# Patient Record
Sex: Male | Born: 1970 | Race: Black or African American | Hispanic: No | Marital: Single | Smoking: Never smoker
Health system: Southern US, Community
[De-identification: ages and names within clinical notes are randomized; demographics above are authoritative.]

---

## 1999-04-30 ENCOUNTER — Emergency Department (HOSPITAL_COMMUNITY): Admission: EM | Admit: 1999-04-30 | Discharge: 1999-04-30 | Payer: Self-pay | Admitting: Emergency Medicine

## 1999-06-19 ENCOUNTER — Ambulatory Visit (HOSPITAL_COMMUNITY): Admission: RE | Admit: 1999-06-19 | Discharge: 1999-06-19 | Payer: Self-pay | Admitting: Internal Medicine

## 1999-06-19 ENCOUNTER — Encounter: Payer: Self-pay | Admitting: Internal Medicine

## 2007-05-22 ENCOUNTER — Emergency Department (HOSPITAL_COMMUNITY): Admission: EM | Admit: 2007-05-22 | Discharge: 2007-05-22 | Payer: Self-pay | Admitting: Family Medicine

## 2009-08-18 ENCOUNTER — Emergency Department (HOSPITAL_COMMUNITY): Admission: EM | Admit: 2009-08-18 | Discharge: 2009-08-18 | Payer: Self-pay | Admitting: Family Medicine

## 2009-08-28 ENCOUNTER — Emergency Department (HOSPITAL_COMMUNITY): Admission: EM | Admit: 2009-08-28 | Discharge: 2009-08-28 | Payer: Self-pay | Admitting: Emergency Medicine

## 2009-10-11 ENCOUNTER — Ambulatory Visit (HOSPITAL_COMMUNITY): Admission: RE | Admit: 2009-10-11 | Discharge: 2009-10-11 | Payer: Self-pay | Admitting: Internal Medicine

## 2010-02-13 ENCOUNTER — Ambulatory Visit (HOSPITAL_COMMUNITY): Admission: RE | Admit: 2010-02-13 | Discharge: 2010-02-13 | Payer: Self-pay | Admitting: Internal Medicine

## 2010-03-22 ENCOUNTER — Ambulatory Visit (HOSPITAL_COMMUNITY): Admission: RE | Admit: 2010-03-22 | Discharge: 2010-03-22 | Payer: Self-pay | Admitting: Internal Medicine

## 2010-08-27 LAB — POCT RAPID STREP A (OFFICE): Streptococcus, Group A Screen (Direct): NEGATIVE

## 2010-08-27 LAB — STREP A DNA PROBE: Group A Strep Probe: NEGATIVE

## 2011-01-10 ENCOUNTER — Ambulatory Visit (HOSPITAL_BASED_OUTPATIENT_CLINIC_OR_DEPARTMENT_OTHER): Payer: BC Managed Care – PPO | Attending: Internal Medicine

## 2011-01-10 DIAGNOSIS — G4710 Hypersomnia, unspecified: Secondary | ICD-10-CM | POA: Insufficient documentation

## 2011-01-12 DIAGNOSIS — G4710 Hypersomnia, unspecified: Secondary | ICD-10-CM

## 2011-01-12 DIAGNOSIS — G4730 Sleep apnea, unspecified: Secondary | ICD-10-CM

## 2011-01-12 NOTE — Procedures (Signed)
NAME:  [place], ANTOLIN NO.:  [id number]  MEDICAL RECORD NO.:  [id number]          PATIENT TYPE:  OUT  LOCATION:  SLEEP CENTER                 FACILITY:  [facility]  PHYSICIAN:  Albertus Chiarelli D. Maple Hudson, MD, FCCP, FACPDATE OF BIRTH:  Nov 07[birth year] 1970  DATE OF STUDY:  01/10/2011                           NOCTURNAL POLYSOMNOGRAM  REFERRING PHYSICIAN:  Margaretmary Bayley, M.D.  INDICATION FOR STUDY:  Hypersomnia with sleep apnea.  EPWORTH SLEEPINESS SCORE:  14/24, BMI 30.1.  Weight 210 pounds, height 70 inches.  Neck 16 inches.  MEDICATIONS:  Home medications are charted as "none".  SLEEP ARCHITECTURE:  Total sleep time 363.5 minutes with sleep efficiency 94%.  Stage I 2.2%, stage II 84.2%, stage III 1%, REM 12.7% of total sleep time.  Sleep latency 14 minutes, REM latency 9 minutes, awake after sleep onset 8.5 minutes, arousal index 23.1.  BEDTIME MEDICATION:  None.  RESPIRATORY DATA:  Apnea-hypopnea index (AHI) 2.8 per hour.  A total of 17 events was scored including 1 obstructive apnea, 1 central apnea and 15 hypopneas.  Events were not positional.  REM/AHI 5.2 per hour.  There were insufficient numbers of events to qualify for CPAP titration on this study night.  OXYGEN DATA:  Moderate snoring with oxygen desaturation to a nadir of 91% and a mean oxygen saturation through the study of 94.9% on room air.  CARDIAC DATA:  Normal sinus rhythm.  MOVEMENT-PARASOMNIA:  No significant movement disturbance.  No bathroom trips.  IMPRESSIONS-RECOMMENDATIONS: 1. Unremarkable sleep architecture for sleep center environment with     no medication taken. 2. Occasional respiratory event with sleep disturbance, within normal     limits, AHI 2.8 per hour (normal adult     range 0-5 per hour).  Moderate snoring with oxygen desaturation to     a nadir of 91% and a mean oxygen saturation through the study of     94.9% on room air.     Deaveon Schoen D. Maple Hudson, MD, [facility], FACP Diplomate, Research scientist (medical) of Sleep Medicine Electronically Signed    CDY/MEDQ  D:  01/12/2011 10:14:37  T:  01/12/2011 10:39:00  Job:  [id number]

## 2011-01-16 ENCOUNTER — Encounter (HOSPITAL_BASED_OUTPATIENT_CLINIC_OR_DEPARTMENT_OTHER): Payer: Self-pay

## 2017-05-23 ENCOUNTER — Encounter (INDEPENDENT_AMBULATORY_CARE_PROVIDER_SITE_OTHER): Payer: Self-pay | Admitting: Orthopaedic Surgery

## 2017-05-23 ENCOUNTER — Ambulatory Visit (INDEPENDENT_AMBULATORY_CARE_PROVIDER_SITE_OTHER): Payer: Self-pay

## 2017-05-23 ENCOUNTER — Ambulatory Visit (INDEPENDENT_AMBULATORY_CARE_PROVIDER_SITE_OTHER): Payer: BLUE CROSS/BLUE SHIELD | Admitting: Orthopaedic Surgery

## 2017-05-23 VITALS — BP 139/85 | HR 68 | Resp 14 | Ht 71.0 in | Wt 217.0 lb

## 2017-05-23 DIAGNOSIS — M25562 Pain in left knee: Secondary | ICD-10-CM | POA: Diagnosis not present

## 2017-05-23 MED ORDER — METHYLPREDNISOLONE ACETATE 40 MG/ML IJ SUSP
80.0000 mg | INTRAMUSCULAR | Status: AC | PRN
  Administered 2017-05-23: 80 mg

## 2017-05-23 MED ORDER — BUPIVACAINE HCL 0.5 % IJ SOLN
2.0000 mL | INTRAMUSCULAR | Status: AC | PRN
  Administered 2017-05-23: 2 mL via INTRA_ARTICULAR

## 2017-05-23 MED ORDER — LIDOCAINE HCL 1 % IJ SOLN
2.0000 mL | INTRAMUSCULAR | Status: AC | PRN
  Administered 2017-05-23: 2 mL

## 2017-05-23 NOTE — Progress Notes (Signed)
Office Visit Note   Patient: Edward Kennedy           Date of Birth: 01/30/1971           MRN: [id number] Visit Date: 05/23/2017              Requested by: No referring provider defined for this encounter. PCP: Laurena Slimmerlark, Preston S, MD   Assessment & Plan: Visit Diagnoses:  1. Acute pain of left knee     Plan: Recent onset of pain and effusion left knee without injury or trauma. Works out at [employer] 3-4 times a week. Pain is predominantly along the medial compartment. He externally could have a tear of the medial meniscus. I'm going to aspirate his knee and injected cortisone and monitor his response  Follow-Up Instructions: Return if symptoms worsen or fail to improve.   Orders:  Orders Placed This Encounter  Procedures  . Large Joint Inj: L knee  . XR KNEE 3 VIEW LEFT   No orders of the defined types were placed in this encounter.     Procedures: Large Joint Inj: L knee on 05/23/2017 1:23 PM Indications: pain and diagnostic evaluation Details: 25 G 1.5 in needle, anteromedial approach  Arthrogram: No  Medications: 2 mL lidocaine 1 %; 2 mL bupivacaine 0.5 %; 80 mg methylPREDNISolone acetate 40 MG/ML Aspirate: 30 mL blood-tinged and clear Outcome: tolerated well, no immediate complications Procedure, treatment alternatives, risks and benefits explained, specific risks discussed. Consent was given by the patient. Patient was prepped and draped in the usual sterile fashion.       Clinical Data: No additional findings.   Subjective: Chief Complaint  Patient presents with  . Left Knee - Joint Swelling  . Joint Swelling    Pain with walking x 3 weeks after working out, grinding, stiffness, swelling, no injury, no surgery, not diabetic  No recent injury or trauma. Has had some effusion in his left knee. No feeling of instability. Predominant pain is along the medial compartment. Occasionally feels some fullness in the popliteal space. No calf pain no numbness or  tingling  HPI  Review of Systems  Constitutional: Negative for activity change.  HENT: Negative for trouble swallowing.   Eyes: Negative for pain.  Respiratory: Negative for shortness of breath.   Cardiovascular: Positive for leg swelling.  Gastrointestinal: Negative for constipation.  Endocrine: Negative for cold intolerance.  Genitourinary: Negative for difficulty urinating.  Musculoskeletal: Positive for joint swelling.  Skin: Negative for rash.  Allergic/Immunologic: Positive for food allergies.  Neurological: Negative for numbness.  Hematological: Does not bruise/bleed easily.  Psychiatric/Behavioral: Negative for sleep disturbance.     Objective: Vital Signs: BP 139/85 (BP Location: Left Arm, Patient Position: Sitting, Cuff Size: Normal)   Pulse 68   Resp 14   Ht 5\' 11"  (1.803 m)   Wt 217 lb (98.4 kg)   BMI 30.27 kg/m   Physical Exam  Ortho Exam left knee with positive effusion. Diffuse medial joint tenderness. Negative instability. Negative anterior drawer sign. No opening with a varus or valgus stress. No lateral joint pain or patellar discomfort. No popliteal mass or fullness on exam today. No calf pain. Neurovascular exam intact. Straight leg raise negative painless range of motion hips  Specialty Comments:  No specialty comments available.  Imaging: Xr Knee 3 View Left  Result Date: 05/23/2017 3 views of the left knee were obtained standing projection. Did not see any abnormality along either medial or lateral joint. No ectopic  calcification. Patella tracks in the midline. No evidence of fracture.    PMFS History: There are no active problems to display for this patient.  History reviewed. No pertinent past medical history.  History reviewed. No pertinent family history.  History reviewed. No pertinent surgical history. Social History   Occupational History  . Not on file  Tobacco Use  . Smoking status: Never Smoker  . Smokeless tobacco: Never Used   Substance and Sexual Activity  . Alcohol use: Yes    Alcohol/week: 1.2 oz    Types: 2 Glasses of wine per week  . Drug use: No  . Sexual activity: Not on file

## 2017-11-18 ENCOUNTER — Ambulatory Visit (INDEPENDENT_AMBULATORY_CARE_PROVIDER_SITE_OTHER): Payer: BLUE CROSS/BLUE SHIELD | Admitting: Orthopaedic Surgery

## 2017-11-18 ENCOUNTER — Encounter (INDEPENDENT_AMBULATORY_CARE_PROVIDER_SITE_OTHER): Payer: Self-pay | Admitting: Orthopaedic Surgery

## 2017-11-18 ENCOUNTER — Ambulatory Visit (INDEPENDENT_AMBULATORY_CARE_PROVIDER_SITE_OTHER): Payer: Self-pay

## 2017-11-18 VITALS — BP 124/84 | HR 64 | Ht 71.0 in | Wt 217.0 lb

## 2017-11-18 DIAGNOSIS — G8929 Other chronic pain: Secondary | ICD-10-CM | POA: Diagnosis not present

## 2017-11-18 DIAGNOSIS — M25562 Pain in left knee: Secondary | ICD-10-CM

## 2017-11-18 DIAGNOSIS — M25532 Pain in left wrist: Secondary | ICD-10-CM

## 2017-11-18 MED ORDER — METHYLPREDNISOLONE ACETATE 40 MG/ML IJ SUSP
80.0000 mg | INTRAMUSCULAR | Status: AC | PRN
  Administered 2017-11-18: 80 mg

## 2017-11-18 MED ORDER — LIDOCAINE HCL 1 % IJ SOLN
2.0000 mL | INTRAMUSCULAR | Status: AC | PRN
  Administered 2017-11-18: 2 mL

## 2017-11-18 MED ORDER — BUPIVACAINE HCL 0.5 % IJ SOLN
2.0000 mL | INTRAMUSCULAR | Status: AC | PRN
  Administered 2017-11-18: 2 mL via INTRA_ARTICULAR

## 2017-11-18 NOTE — Progress Notes (Signed)
Office Visit Note   Patient: Edward Kennedy           Date of Birth: [birth year]           MRN: [id number] Visit Date: 11/18/2017              Requested by: Laurena Slimmer, MD [address] [place], [place] [address] PCP: Laurena Slimmer, MD   Assessment & Plan: Visit Diagnoses:  1. Pain in left wrist   2. Chronic pain of left knee     Plan: Current pain and effusion left knee.  Aspirate was clear and yellow.  Reinjected cortisone for relief.  Will order MRI scan to define the problem.  Has been experiencing some pain at the ulnocarpal joint left wrist without obvious injury or trauma.  X-rays were negative.  Could haveTFCC tear.  Will monitor this consider MR arthrogram is to be a problem  Follow-Up Instructions: Return after MRI left knee.   Orders:  Orders Placed This Encounter  Procedures  . Large Joint Inj: L knee  . XR Wrist Complete Left  . MR Knee Left w/o contrast   No orders of the defined types were placed in this encounter.     Procedures: Large Joint Inj: L knee on 11/18/2017 4:01 PM Indications: pain and diagnostic evaluation Details: 25 G 1.5 in needle, anteromedial approach  Arthrogram: No  Medications: 2 mL lidocaine 1 %; 2 mL bupivacaine 0.5 %; 80 mg methylPREDNISolone acetate 40 MG/ML Aspirate: 22 mL yellow and clear Outcome: tolerated well, no immediate complications Procedure, treatment alternatives, risks and benefits explained, specific risks discussed. Consent was given by the patient. Patient was prepped and draped in the usual sterile fashion.       Clinical Data: No additional findings.   Subjective: Chief Complaint  Patient presents with  . Left Knee - Pain  . New Patient (Initial Visit)    l knee pain for few months, feels like fluid in knee and also states bump on posterior part of knee. no injury. l wrist pain possible cyst for 3 weeks  Last seen in December 2018 with painful swollen left knee.  Aspirate was  clear.  Cortisone injection made a "huge difference".  He had some recurrence of his pain without further injury or trauma.  He does work out at [employer] week but has not had any specific injury or trauma in the past or even recently.  Seems to be a function of increased activity.  Not experience any skin rashes or other joint problems.  No evidence of a hemarthrosis in the past. Also noted onset of left wrist pain approximately a month ago without injury or trauma.  He has some pain with hyperextension of his wrist in the area of the ulnar carpal joint.  There is been no erythema or ecchymosis.  No numbness or tingling  HPI  Review of Systems  Constitutional: Negative for fatigue and fever.  HENT: Negative for ear pain.   Eyes: Negative for pain.  Respiratory: Positive for cough. Negative for shortness of breath.   Cardiovascular: Positive for leg swelling.  Gastrointestinal: Negative for constipation and diarrhea.  Genitourinary: Negative for difficulty urinating.  Musculoskeletal: Negative for back pain and neck pain.  Skin: Negative for rash.  Allergic/Immunologic: Positive for food allergies.  Neurological: Positive for weakness. Negative for numbness.  Hematological: Does not bruise/bleed easily.  Psychiatric/Behavioral: Negative for sleep disturbance.     Objective: Vital Signs: BP 124/84 (  BP Location: Right Arm, Patient Position: Sitting, Cuff Size: Normal)   Pulse 64   Ht 5\' 11"  (1.803 m)   Wt 217 lb (98.4 kg)   BMI 30.27 kg/m   Physical Exam  Constitutional: He is oriented to person, place, and time. He appears well-developed and well-nourished.  HENT:  Mouth/Throat: Oropharynx is clear and moist.  Eyes: Pupils are equal, round, and reactive to light. EOM are normal.  Pulmonary/Chest: Effort normal.  Neurological: He is alert and oriented to person, place, and time.  Skin: Skin is warm and dry.  Psychiatric: He has a normal mood and affect. His behavior is normal.     Ortho Exam awake alert and oriented x3.  Comfortable sitting.  Left knee with positive effusion.  No localized tenderness along the medial lateral compartment.  No instability.  Negative anterior drawer sign.  Might have a small popliteal cyst but no pain.  No calf pain or distal edema.  Neurovascular exam intact.  Painless range of motion of both hips.  Straight leg raise negative.  No pain with patella motion. Mild tenderness in the area of the TFCC left wrist no popping or clicking.  No skin changes.  Neurovascular exam intact Specialty Comments:  No specialty comments available.  Imaging: Xr Wrist Complete Left  Result Date: 11/18/2017 Films of the left wrist were obtained in several projections.  Patient is symptomatic in the area of the radiocarpal and ulnocarpal joint.  No abnormalities identified.  No fracture.  No ectopic calcification.  No widening of the intercarpal space    PMFS History: There are no active problems to display for this patient.  History reviewed. No pertinent past medical history.  History reviewed. No pertinent family history.  History reviewed. No pertinent surgical history. Social History   Occupational History  . Not on file  Tobacco Use  . Smoking status: Never Smoker  . Smokeless tobacco: Never Used  Substance and Sexual Activity  . Alcohol use: Yes    Alcohol/week: 1.2 oz    Types: 2 Glasses of wine per week  . Drug use: No  . Sexual activity: Not on file

## 2017-12-02 ENCOUNTER — Ambulatory Visit
Admission: RE | Admit: 2017-12-02 | Discharge: 2017-12-02 | Disposition: A | Discharge status: Home / Self Care | Payer: BLUE CROSS/BLUE SHIELD | Source: Ambulatory Visit | Attending: Orthopaedic Surgery | Admitting: Orthopaedic Surgery

## 2017-12-02 DIAGNOSIS — M25562 Pain in left knee: Principal | ICD-10-CM

## 2017-12-02 DIAGNOSIS — G8929 Other chronic pain: Secondary | ICD-10-CM

## 2017-12-15 ENCOUNTER — Ambulatory Visit (INDEPENDENT_AMBULATORY_CARE_PROVIDER_SITE_OTHER): Payer: BLUE CROSS/BLUE SHIELD | Admitting: Orthopaedic Surgery

## 2017-12-25 ENCOUNTER — Encounter (INDEPENDENT_AMBULATORY_CARE_PROVIDER_SITE_OTHER): Payer: Self-pay | Admitting: Orthopaedic Surgery

## 2017-12-25 ENCOUNTER — Ambulatory Visit (INDEPENDENT_AMBULATORY_CARE_PROVIDER_SITE_OTHER): Payer: BLUE CROSS/BLUE SHIELD | Admitting: Orthopaedic Surgery

## 2017-12-25 VITALS — BP 130/104 | HR 82 | Ht 71.0 in | Wt 217.0 lb

## 2017-12-25 DIAGNOSIS — M25562 Pain in left knee: Secondary | ICD-10-CM | POA: Diagnosis not present

## 2017-12-25 DIAGNOSIS — G8929 Other chronic pain: Secondary | ICD-10-CM | POA: Diagnosis not present

## 2017-12-25 NOTE — Progress Notes (Signed)
Office Visit Note   Patient: Edward Kennedy           Date of Birth: 1971-02-28           MRN: [id number] Visit Date: 12/25/2017              Requested by: Laurena Slimmer, MD No address on file PCP: Laurena Slimmer, MD   Assessment & Plan: Visit Diagnoses:  1. Chronic pain of left knee     Plan: Left knee pain appears to be a combination of factors.  There is a small tear of the anterior horn of the lateral meniscus in the posterior horn of the medial meniscus.  There are also tricompartmental degenerative changes which I believe trouble.  Presently he is not that symptomatic.  Long discussion regarding treatment options over time including cortisone injections and Visco supplement and even potentially arthroscopy.  He will let me know  Follow-Up Instructions: Return if symptoms worsen or fail to improve.   Orders:  No orders of the defined types were placed in this encounter.  No orders of the defined types were placed in this encounter.     Procedures: No procedures performed   Clinical Data: No additional findings.   Subjective: Chief Complaint  Patient presents with  . Follow-up    MRI REVIEW L KNEE  Healing better since the MRI scan.  Still having some swelling in his knee but excellent range of motion.  No feeling of instability.  It is less.  Scan demonstrates some tearing of the medial lateral meniscus as well as tricompartmental degenerative changes predominate in the patellofemoral joint.  Not that symptomatic at present  HPI  Review of Systems  Constitutional: Negative for fatigue and fever.  HENT: Negative for ear pain.   Eyes: Negative for pain.  Respiratory: Negative for cough and shortness of breath.   Cardiovascular: Positive for leg swelling.  Gastrointestinal: Negative for constipation and diarrhea.  Genitourinary: Negative for difficulty urinating.  Musculoskeletal: Negative for neck pain.  Skin: Negative for rash.  Allergic/Immunologic:  Positive for food allergies.  Neurological: Positive for weakness. Negative for numbness.  Hematological: Does not bruise/bleed easily.  Psychiatric/Behavioral: Negative for sleep disturbance.     Objective: Vital Signs: BP (!) 130/104 (BP Location: Left Arm, Patient Position: Sitting, Cuff Size: Normal)   Pulse 82   Ht 5\' 11"  (1.803 m)   Wt 217 lb (98.4 kg)   BMI 30.27 kg/m   Physical Exam  Constitutional: He is oriented to person, place, and time. He appears well-developed and well-nourished.  HENT:  Mouth/Throat: Oropharynx is clear and moist.  Eyes: Pupils are equal, round, and reactive to light. EOM are normal.  Pulmonary/Chest: Effort normal.  Neurological: He is alert and oriented to person, place, and time.  Skin: Skin is warm and dry.  Psychiatric: He has a normal mood and affect. His behavior is normal.    Ortho Exam awake alert and oriented x3.  Comfortable sitting.  Examination of the left knee demonstrates a small effusion.  Full extension and flexed over 120 degrees.  No instability.  No significant medial lateral joint pain.  Some patellar crepitation no distal edema.  No popliteal fullness.  Vascular exam intact  Specialty Comments:  No specialty comments available.  Imaging: No results found.   PMFS History: There are no active problems to display for this patient.  History reviewed. No pertinent past medical history.  History reviewed. No pertinent family history.  History  reviewed. No pertinent surgical history. Social History   Occupational History  . Not on file  Tobacco Use  . Smoking status: Never Smoker  . Smokeless tobacco: Never Used  Substance and Sexual Activity  . Alcohol use: Yes    Alcohol/week: 1.2 oz    Types: 2 Glasses of wine per week  . Drug use: No  . Sexual activity: Not on file

## 2019-08-05 ENCOUNTER — Ambulatory Visit: Payer: Self-pay | Attending: Internal Medicine

## 2019-08-05 DIAGNOSIS — Z23 Encounter for immunization: Secondary | ICD-10-CM | POA: Insufficient documentation

## 2019-08-05 NOTE — Progress Notes (Signed)
   Covid-19 Vaccination Clinic  Name:  WAVERLY CHAVARRIA    MRN: [id number] DOB: 1970/10/09  08/05/2019  Mr. Usman was observed post Covid-19 immunization for 15 minutes without incident. He was provided with Vaccine Information Sheet and instruction to access the V-Safe system.   Mr. Vandeberg was instructed to call 911 with any severe reactions post vaccine: Marland Kitchen Difficulty breathing  . Swelling of face and throat  . A fast heartbeat  . A bad rash all over body  . Dizziness and weakness

## 2019-09-07 ENCOUNTER — Ambulatory Visit: Payer: Self-pay | Attending: Internal Medicine

## 2019-09-07 DIAGNOSIS — Z23 Encounter for immunization: Secondary | ICD-10-CM

## 2019-09-07 NOTE — Progress Notes (Signed)
   Covid-19 Vaccination Clinic  Name:  Edward Kennedy    MRN: [id number] DOB: 20-Apr-1971  09/07/2019  Mr. Kohles was observed post Covid-19 immunization for 15 minutes without incident. He was provided with Vaccine Information Sheet and instruction to access the V-Safe system.   Mr. Vandunk was instructed to call 911 with any severe reactions post vaccine: Marland Kitchen Difficulty breathing  . Swelling of face and throat  . A fast heartbeat  . A bad rash all over body  . Dizziness and weakness   Immunizations Administered    Name Date Dose VIS Date Route   Moderna COVID-19 Vaccine 09/07/2019 11:11 AM 0.5 mL 05/04/2019 Intramuscular   Manufacturer: Moderna   Lot: [id number]   NDC: 39432-003-79

## 2020-01-12 IMAGING — MR MR KNEE*L* W/O CM
4 of 7 series · 22 of 40 positions shown · non-contrast
Comparison: None.

CLINICAL DATA: Eight month history of left knee pain and weakness.

EXAM:
MRI OF THE LEFT KNEE WITHOUT CONTRAST
TECHNIQUE: Multiplanar, multisequence MR imaging of the knee was performed. No
intravenous contrast was administered.

[Series 3: T2 fat-sat · axial · 4.0mm · 0.50mm/px · z∈[-66,+69]mm · 6 of 28 slices shown]
[im 1/28]
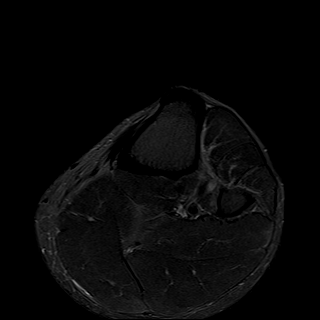
[im 6/28]
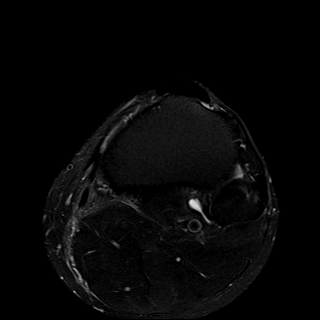
[im 11/28]
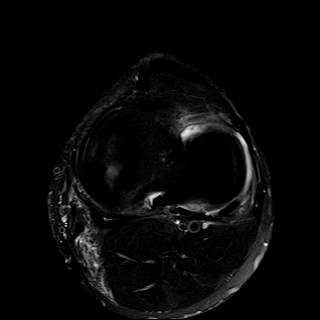
[im 17/28]
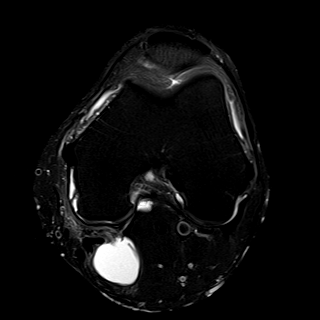
[im 22/28]
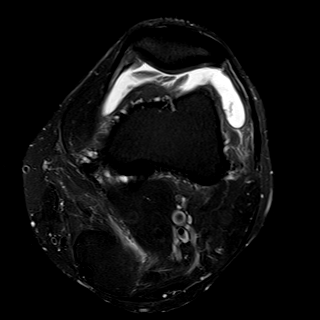
[im 28/28]
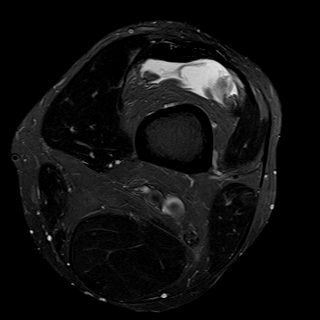

[Series 7: PD fat-sat · sagittal · 3.0mm · 0.29mm/px · 6 of 29 slices shown (1 of 3)]
[im 1/29]
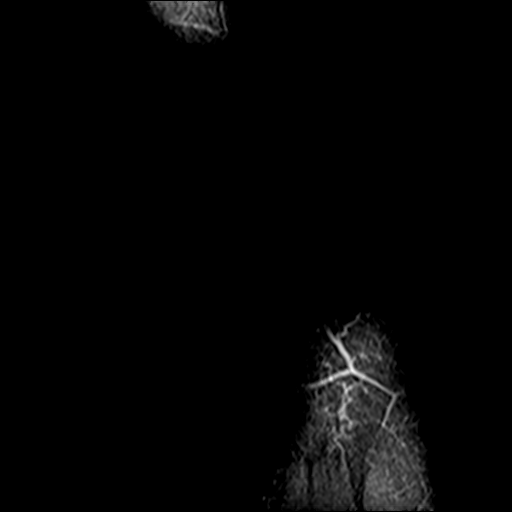
[im 6/29]
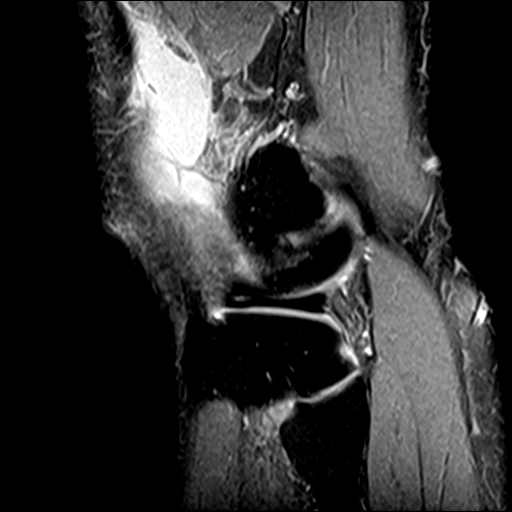
[im 12/29]
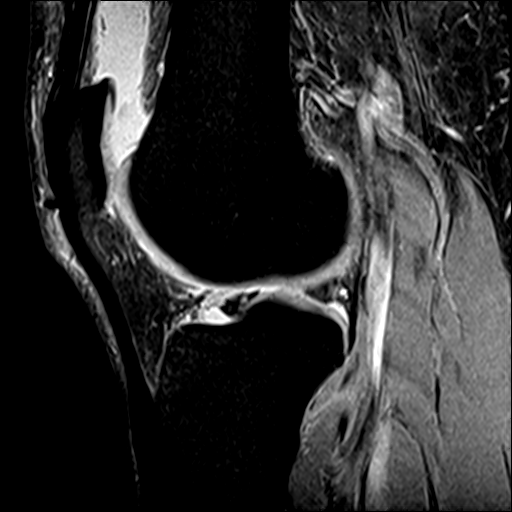
[im 17/29]
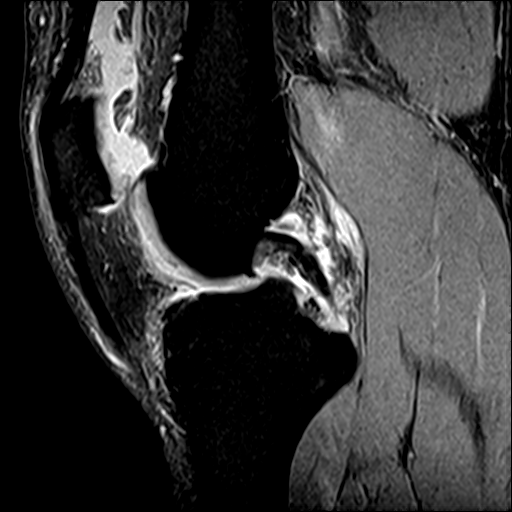
[im 23/29]
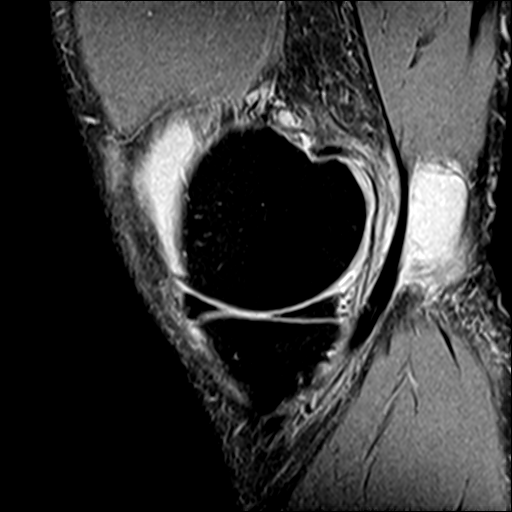
[im 29/29]
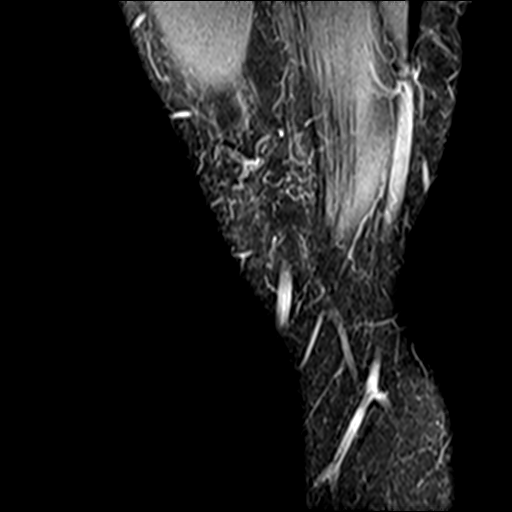

[Series 8: PD fat-sat · coronal · 3.0mm · 0.29mm/px · 8 of 40 slices shown (2 of 3)]
[im 1/40]
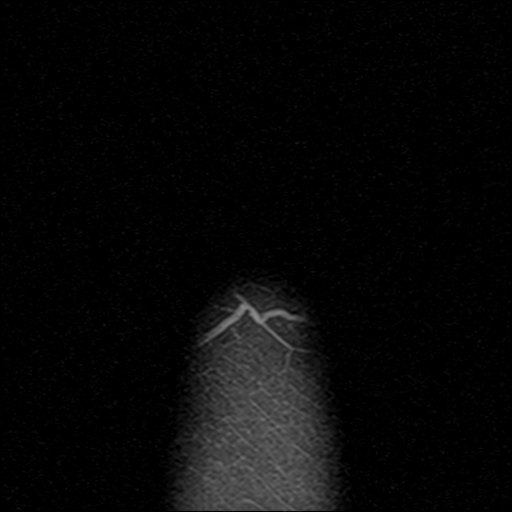
[im 6/40]
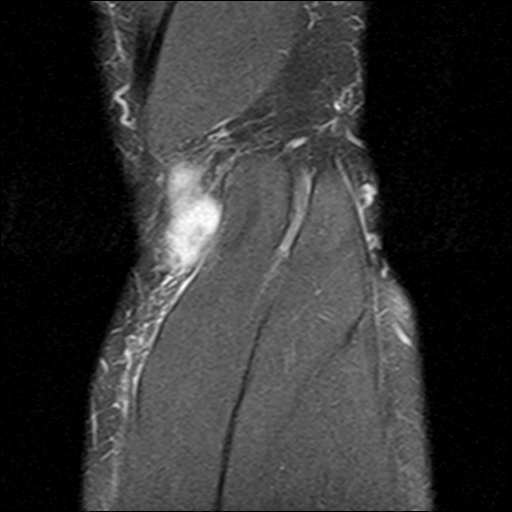
[im 12/40]
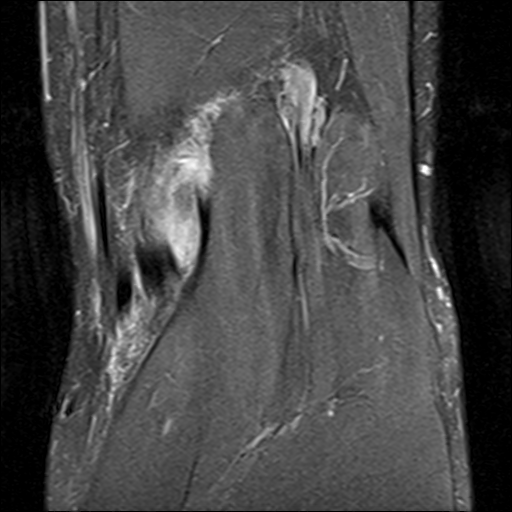
[im 17/40]
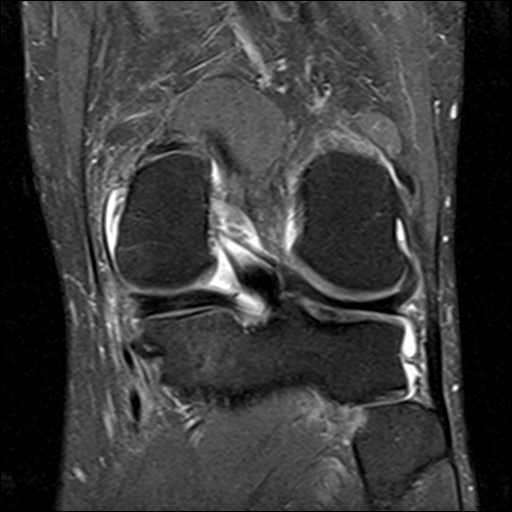
[im 23/40]
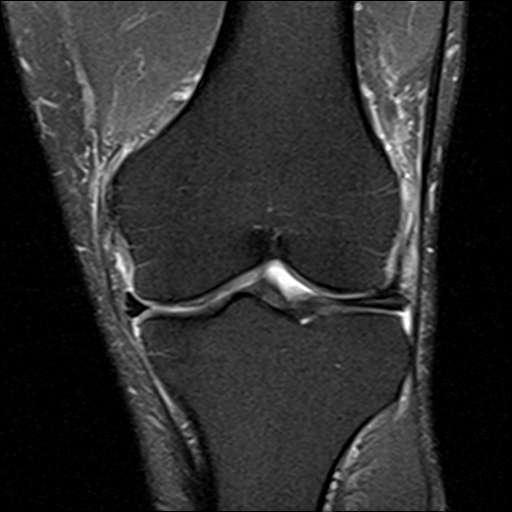
[im 28/40]
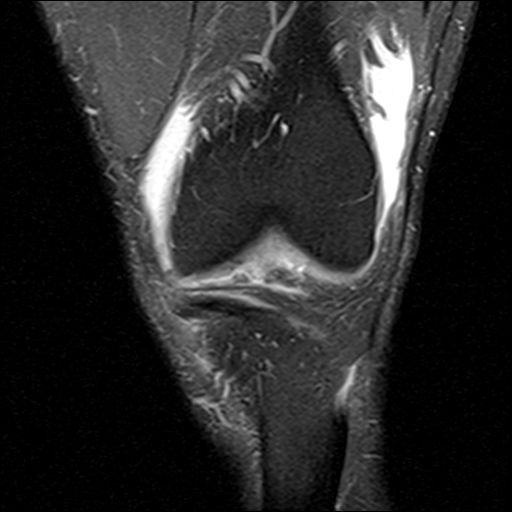
[im 34/40]
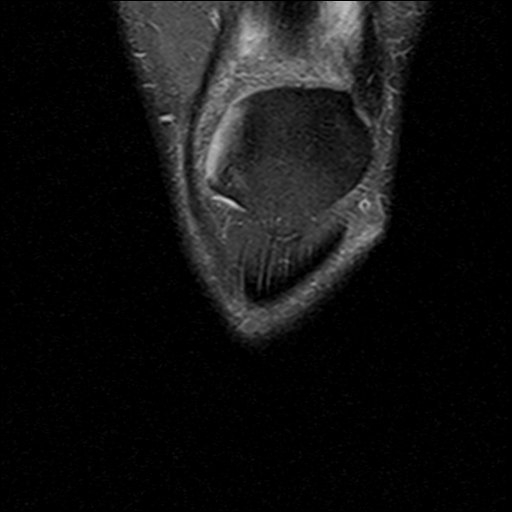
[im 40/40]
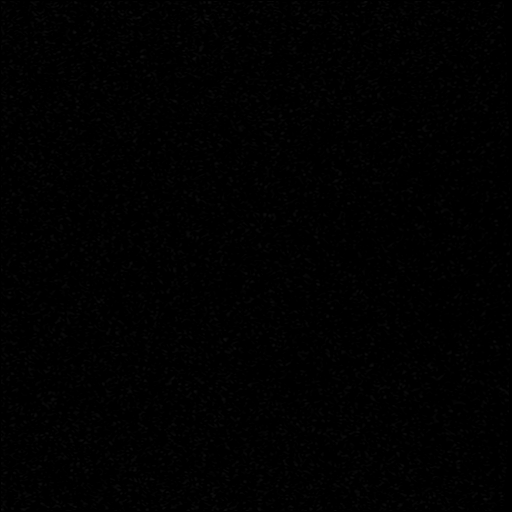

[Series 9: PD fat-sat · coronal · 2.0mm · 0.29mm/px · 2 of 11 slices shown (3 of 3)]
[im 1/11]
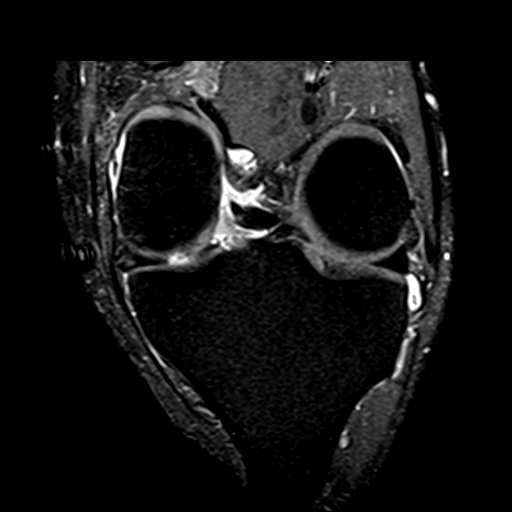
[im 11/11]
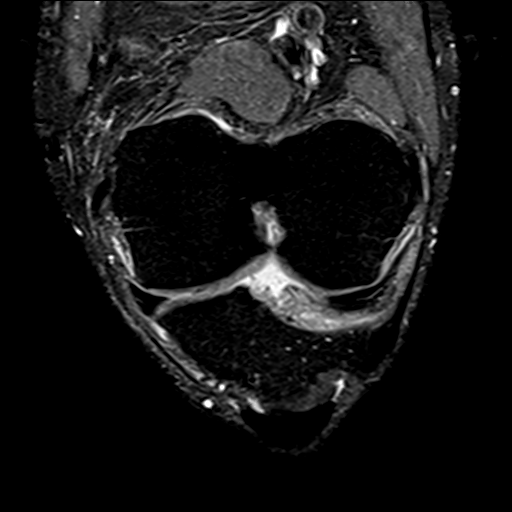

[22 of 40 positions shown; findings below may reference images not displayed]

FINDINGS: Radiographs 05/23/2017

MENISCI

Medial meniscus: Partial thickness radial tear involving the
posterior horn. Intrasubstance degenerative changes in the midbody
region.

Lateral meniscus:  Degenerated and torn anterior horn.

LIGAMENTS

Cruciates:  Intact

Collaterals:  Intact

CARTILAGE

Patellofemoral:  Moderate degenerative chondrosis.

Medial: Moderate degenerative chondrosis with areas of moderate
cartilage thinning along with early joint space narrowing and
spurring.

Lateral:  Mild degenerative chondrosis.

Joint: Moderate to large joint effusion with frondlike fatty
excrescences into the suprapatellar bursa. Findings consistent with
lipoma arborescens.

Popliteal Fossa: Moderate-sized Baker's cyst containing some debris.

Extensor Mechanism: The patella retinacular structures are intact
and the quadriceps and patellar tendons are intact.

Bones:  No acute bony findings.

Other: Normal knee musculature.
IMPRESSION: 1. Medial and lateral meniscus tears as detailed above.
2. Intact ligamentous structures and no acute bony findings.
3. Moderate to large joint effusion and lipoma arborescens.
4. Moderate-sized Baker's cyst.
5. Mild tricompartmental degenerative changes most notable in the
medial compartment.
